# Patient Record
Sex: Female | Born: 1950 | Race: White | Hispanic: No | Marital: Married | Smoking: Never smoker
Health system: Southern US, Community
[De-identification: ages and names within clinical notes are randomized; demographics above are authoritative.]

## PROBLEM LIST (undated history)

## (undated) HISTORY — PX: WRIST SURGERY: SHX841

---

## 2014-05-18 ENCOUNTER — Encounter (HOSPITAL_BASED_OUTPATIENT_CLINIC_OR_DEPARTMENT_OTHER): Payer: Self-pay | Admitting: Emergency Medicine

## 2014-05-18 ENCOUNTER — Emergency Department (HOSPITAL_BASED_OUTPATIENT_CLINIC_OR_DEPARTMENT_OTHER)
Admission: EM | Admit: 2014-05-18 | Discharge: 2014-05-18 | Disposition: A | Discharge status: Home / Self Care | Payer: BC Managed Care – PPO | Attending: Emergency Medicine | Admitting: Emergency Medicine

## 2014-05-18 DIAGNOSIS — Y9289 Other specified places as the place of occurrence of the external cause: Secondary | ICD-10-CM | POA: Insufficient documentation

## 2014-05-18 DIAGNOSIS — S40029A Contusion of unspecified upper arm, initial encounter: Secondary | ICD-10-CM | POA: Insufficient documentation

## 2014-05-18 DIAGNOSIS — Z23 Encounter for immunization: Secondary | ICD-10-CM | POA: Insufficient documentation

## 2014-05-18 DIAGNOSIS — Y9389 Activity, other specified: Secondary | ICD-10-CM | POA: Insufficient documentation

## 2014-05-18 DIAGNOSIS — W540XXA Bitten by dog, initial encounter: Secondary | ICD-10-CM | POA: Insufficient documentation

## 2014-05-18 DIAGNOSIS — S61209A Unspecified open wound of unspecified finger without damage to nail, initial encounter: Secondary | ICD-10-CM | POA: Insufficient documentation

## 2014-05-18 MED ORDER — TETANUS-DIPHTH-ACELL PERTUSSIS 5-2.5-18.5 LF-MCG/0.5 IM SUSP
0.5000 mL | Freq: Once | INTRAMUSCULAR | Status: AC
  Administered 2014-05-18: 0.5 mL via INTRAMUSCULAR
  Filled 2014-05-18: qty 0.5

## 2014-05-18 MED ORDER — IBUPROFEN 800 MG PO TABS: 800.0000 mg | ORAL_TABLET | Freq: Three times a day (TID) | ORAL | Status: AC

## 2014-05-18 MED ORDER — AMOXICILLIN-POT CLAVULANATE 875-125 MG PO TABS
1.0000 | ORAL_TABLET | Freq: Once | ORAL | Status: AC
  Administered 2014-05-18: 1 via ORAL
  Filled 2014-05-18: qty 1

## 2014-05-18 MED ORDER — AMOXICILLIN-POT CLAVULANATE 875-125 MG PO TABS: 1.0000 | ORAL_TABLET | Freq: Two times a day (BID) | ORAL | Status: DC

## 2014-05-18 NOTE — ED Notes (Signed)
Pt soaking in betadine

## 2014-05-18 NOTE — ED Notes (Signed)
"  I got between two dogs".  Deep linear wounds to both sides of right 5th finger. Dogs belong to pt.

## 2014-05-18 NOTE — ED Notes (Addendum)
separating 2 dogs from fight  Has lac to rt little finger  approx 1 inch on both sides  Bleeding controlled   Dogs belong to pt   Dogs are utd on shots

## 2014-05-18 NOTE — ED Notes (Signed)
MD at bedside. 

## 2014-05-18 NOTE — Discharge Instructions (Signed)

## 2014-05-18 NOTE — ED Provider Notes (Signed)
CSN: [id number]     Arrival date & time 05/18/14  2108 History  This chart was scribed for Roberta Angell Smitty CordsK Breckyn Ticas-Rasch, MD by Quintella ReichertMatthew Underwood, ED scribe.  This patient was seen in room MH12/MH12 and the patient's care was started at 11:25 PM.   Chief Complaint  Patient presents with  . Animal Bite    [name] presenting with animal bite. The history is provided by the patient. No language interpreter was used.  Animal Bite Contact animal:  Dog (2 dogs that were patient's own with up to date shots) Location:  Finger Finger injury location:  R little finger Pain details:    Severity:  No pain   Progression:  Unchanged Incident location:  Home Provoked: the dogs were fighting and she intervened.   Notifications:  None Animal's rabies vaccination status:  Up to date Animal in possession: yes   Tetanus status: received today in the ED. Relieved by:  Nothing Worsened by:  Nothing tried Ineffective treatments:  None tried Associated symptoms: no fever     HPI Comments: Roberta Bush is a 63 y.o. female who presents to the Emergency Department complaining of a dog bite to the right 5th finger sustained pta.  Pt was between her dogs which are who are UTD on their shots when she was bit on both sides of the 5th finger.      History reviewed. No pertinent past medical history. Past Surgical History  Procedure Laterality Date  . Wrist surgery     No family history on file. History  Substance Use Topics  . Smoking status: Never Smoker   . Smokeless tobacco: Not on file  . Alcohol Use: Yes     Comment: socially   OB History   Grav Para Term Preterm Abortions TAB SAB Ect Mult Living                   Review of Systems  Constitutional: Negative for fever.  Skin: Positive for wound.  All other systems reviewed and are negative.     Allergies  Review of patient's allergies indicates no known allergies.  Home Medications   Prior to Admission  medications   Not on File   There were no vitals taken for this visit.  Physical Exam  Nursing note and vitals reviewed. Constitutional: She is oriented to person, place, and time. She appears well-developed and well-nourished. No distress.  HENT:  Head: Normocephalic and atraumatic.  Mouth/Throat: Oropharynx is clear and moist.  Eyes: EOM are normal. Pupils are equal, round, and reactive to light.  Neck: Normal range of motion. Neck supple. No tracheal deviation present.  Cardiovascular: Normal rate and regular rhythm.   Pulmonary/Chest: Effort normal and breath sounds normal. No respiratory distress. She has no wheezes. She has no rales.  Abdominal: Soft. Bowel sounds are normal. There is no tenderness. There is no rebound.  Musculoskeletal: Normal range of motion.  Neurological: She is alert and oriented to person, place, and time.  Skin: Skin is warm and dry.  Superficial laceration on lateral right 5th digit, longer laceration on medial palmar aspect.  Scratches and bruise on distal left dorsal forearm  Psychiatric: She has a normal mood and affect. Her behavior is normal.    ED Course  Procedures (including critical care time)  COORDINATION OF CARE: 11:28 PM-Discussed treatment plan which includes Steri-Strips and antibiotics with pt at bedside and pt agreed to plan.     Labs Review  Labs Reviewed - No data to display  Imaging Review No results found.   EKG Interpretation None      MDM   Final diagnoses:  None    Due to the risk of infection as 2 dog bite, will not close the wounds as they will become infected.  steri strips applied to approximate wounds and bulky dressing applied.  Augmentin initiated x 10 days and follow up with hand surgery for ongoing care.  Return for fevers > 101, purulent drainage streaking up the hand or any concerns.      I personally performed the services described in this documentation, which was scribed in my presence. The  recorded information has been reviewed and is accurate.     Jasmine Awe, MD 05/19/14 [phone]

## 2014-05-19 ENCOUNTER — Encounter (HOSPITAL_BASED_OUTPATIENT_CLINIC_OR_DEPARTMENT_OTHER): Payer: Self-pay | Admitting: Emergency Medicine

## 2014-05-23 ENCOUNTER — Encounter (HOSPITAL_BASED_OUTPATIENT_CLINIC_OR_DEPARTMENT_OTHER): Payer: Self-pay | Admitting: Emergency Medicine

## 2014-05-23 ENCOUNTER — Emergency Department (HOSPITAL_BASED_OUTPATIENT_CLINIC_OR_DEPARTMENT_OTHER)
Admission: EM | Admit: 2014-05-23 | Discharge: 2014-05-23 | Disposition: A | Discharge status: Home / Self Care | Payer: BC Managed Care – PPO | Attending: Emergency Medicine | Admitting: Emergency Medicine

## 2014-05-23 DIAGNOSIS — S81009A Unspecified open wound, unspecified knee, initial encounter: Secondary | ICD-10-CM | POA: Insufficient documentation

## 2014-05-23 DIAGNOSIS — T63001A Toxic effect of unspecified snake venom, accidental (unintentional), initial encounter: Secondary | ICD-10-CM | POA: Insufficient documentation

## 2014-05-23 DIAGNOSIS — S81809A Unspecified open wound, unspecified lower leg, initial encounter: Principal | ICD-10-CM

## 2014-05-23 DIAGNOSIS — Y9289 Other specified places as the place of occurrence of the external cause: Secondary | ICD-10-CM | POA: Insufficient documentation

## 2014-05-23 DIAGNOSIS — Z9889 Other specified postprocedural states: Secondary | ICD-10-CM | POA: Diagnosis not present

## 2014-05-23 DIAGNOSIS — Z792 Long term (current) use of antibiotics: Secondary | ICD-10-CM | POA: Insufficient documentation

## 2014-05-23 DIAGNOSIS — Z791 Long term (current) use of non-steroidal anti-inflammatories (NSAID): Secondary | ICD-10-CM | POA: Diagnosis not present

## 2014-05-23 DIAGNOSIS — T63121A Toxic effect of venom of other venomous lizard, accidental (unintentional), initial encounter: Secondary | ICD-10-CM | POA: Diagnosis not present

## 2014-05-23 DIAGNOSIS — T63004A Toxic effect of unspecified snake venom, undetermined, initial encounter: Secondary | ICD-10-CM

## 2014-05-23 DIAGNOSIS — S91009A Unspecified open wound, unspecified ankle, initial encounter: Principal | ICD-10-CM

## 2014-05-23 DIAGNOSIS — Y9301 Activity, walking, marching and hiking: Secondary | ICD-10-CM | POA: Diagnosis not present

## 2014-05-23 LAB — CBC WITH DIFFERENTIAL/PLATELET
BASOS PCT: 0 % (ref 0–1)
Basophils Absolute: 0 10*3/uL (ref 0.0–0.1)
Basophils Absolute: 0 10*3/uL (ref 0.0–0.1)
Basophils Relative: 1 % (ref 0–1)
EOS ABS: 0.2 10*3/uL (ref 0.0–0.7)
EOS PCT: 3 % (ref 0–5)
Eosinophils Absolute: 0.2 10*3/uL (ref 0.0–0.7)
Eosinophils Relative: 2 % (ref 0–5)
HCT: 39.1 % (ref 36.0–46.0)
HEMATOCRIT: 42.2 % (ref 36.0–46.0)
HEMOGLOBIN: 13.4 g/dL (ref 12.0–15.0)
Hemoglobin: 14.5 g/dL (ref 12.0–15.0)
LYMPHS ABS: 2.3 10*3/uL (ref 0.7–4.0)
Lymphocytes Relative: 29 % (ref 12–46)
Lymphocytes Relative: 18 % (ref 12–46)
Lymphs Abs: 1.7 10*3/uL (ref 0.7–4.0)
MCH: 31.2 pg (ref 26.0–34.0)
MCH: 31 pg (ref 26.0–34.0)
MCHC: 34.4 g/dL (ref 30.0–36.0)
MCHC: 34.3 g/dL (ref 30.0–36.0)
MCV: 90.8 fL (ref 78.0–100.0)
MCV: 90.5 fL (ref 78.0–100.0)
MONO ABS: 0.6 10*3/uL (ref 0.1–1.0)
MONOS PCT: 7 % (ref 3–12)
Monocytes Absolute: 0.6 10*3/uL (ref 0.1–1.0)
Monocytes Relative: 8 % (ref 3–12)
NEUTROS PCT: 60 % (ref 43–77)
NEUTROS PCT: 73 % (ref 43–77)
Neutro Abs: 4.7 10*3/uL (ref 1.7–7.7)
Neutro Abs: 6.6 10*3/uL (ref 1.7–7.7)
Platelets: 310 10*3/uL (ref 150–400)
Platelets: 290 10*3/uL (ref 150–400)
RBC: 4.65 MIL/uL (ref 3.87–5.11)
RBC: 4.32 MIL/uL (ref 3.87–5.11)
RDW: 14 % (ref 11.5–15.5)
RDW: 13.9 % (ref 11.5–15.5)
WBC: 7.9 10*3/uL (ref 4.0–10.5)
WBC: 9.1 10*3/uL (ref 4.0–10.5)

## 2014-05-23 LAB — BASIC METABOLIC PANEL
BUN: 21 mg/dL (ref 6–23)
CO2: 24 mEq/L (ref 19–32)
Calcium: 9.7 mg/dL (ref 8.4–10.5)
Chloride: 104 mEq/L (ref 96–112)
Creatinine, Ser: 0.8 mg/dL (ref 0.50–1.10)
GFR calc Af Amer: >90 mL/min (ref >90)
GFR calc non Af Amer: 78 mL/min — ABNORMAL LOW (ref >90)
Glucose, Bld: 113 mg/dL — ABNORMAL HIGH (ref 70–99)
POTASSIUM: 4 meq/L (ref 3.7–5.3)
Sodium: 144 mEq/L (ref 137–147)

## 2014-05-23 LAB — APTT: aPTT: 32 seconds (ref 24–37)

## 2014-05-23 LAB — PROTIME-INR
INR: 1.04 (ref 0.00–1.49)
PROTHROMBIN TIME: 13.4 s (ref 11.6–15.2)

## 2014-05-23 LAB — CK: Total CK: 117 U/L (ref 7–177)

## 2014-05-23 MED ORDER — ONDANSETRON HCL 4 MG/2ML IJ SOLN
INTRAMUSCULAR | Status: AC
  Filled 2014-05-23: qty 2

## 2014-05-23 MED ORDER — SODIUM CHLORIDE 0.9 % IV SOLN
INTRAVENOUS | Status: DC
  Administered 2014-05-23: 01:00:00 via INTRAVENOUS

## 2014-05-23 MED ORDER — ONDANSETRON 8 MG PO TBDP: 8.0000 mg | ORAL_TABLET | Freq: Three times a day (TID) | ORAL | Status: AC | PRN

## 2014-05-23 MED ORDER — FENTANYL CITRATE 0.05 MG/ML IJ SOLN
100.0000 ug | Freq: Once | INTRAMUSCULAR | Status: AC
  Administered 2014-05-23: 100 ug via INTRAVENOUS
  Filled 2014-05-23: qty 2

## 2014-05-23 MED ORDER — HYDROCODONE-ACETAMINOPHEN 5-325 MG PO TABS: 1.0000 | ORAL_TABLET | Freq: Four times a day (QID) | ORAL | Status: DC | PRN

## 2014-05-23 MED ORDER — ONDANSETRON HCL 4 MG/2ML IJ SOLN
4.0000 mg | Freq: Once | INTRAMUSCULAR | Status: AC
  Administered 2014-05-23: 4 mg via INTRAVENOUS

## 2014-05-23 NOTE — ED Notes (Signed)
Pt c/o snake bite to right ankle x 30 mins ago

## 2014-05-23 NOTE — ED Notes (Signed)
Pt nauseated and vomited x 1, immediate relief with zofran, pt po challenged. Able to tolerate fluids and graham crackers. Vs remain stable

## 2014-05-23 NOTE — ED Provider Notes (Addendum)
CSN: [id number]     Arrival date & time 05/23/14  0053 History   First MD Initiated Contact with Patient 05/23/14 0105     Chief Complaint  Patient presents with  . Snake Bite     (Consider location/radiation/quality/duration/timing/severity/associated sxs/prior Treatment) HPI This is a 63 year old female who was walking out of her car about 30 minutes prior to arrival. She was bitten on the right ankle by a snake. The snake was about 16 inches long and a photograph of it taken by her husband reveals it to be a copperhead. She is a single puncture wound to the medial aspect of the right ankle. There is mild, burning pain associated with it. There is little or no swelling. There was some surrounding erythema at the scene and EMS marked improvement her. She denies any systemic symptoms such as chills, fever or body aches. She was recently seen here for dog bite to her right fifth finger and left forearm; her tetanus was updated at that time.  History reviewed. No pertinent past medical history. Past Surgical History  Procedure Laterality Date  . Wrist surgery     History reviewed. No pertinent family history. History  Substance Use Topics  . Smoking status: Never Smoker   . Smokeless tobacco: Not on file  . Alcohol Use: Yes     Comment: socially   OB History   Grav Para Term Preterm Abortions TAB SAB Ect Mult Living                 Review of Systems  All other systems reviewed and are negative.   Allergies  Review of patient's allergies indicates no known allergies.  Home Medications   Prior to Admission medications   Medication Sig Start Date End Date Taking? Authorizing Provider  amoxicillin-clavulanate (AUGMENTIN) 875-125 MG per tablet Take 1 tablet by mouth 2 (two) times daily. One po bid x 10 days 05/18/14   April K Palumbo-Rasch, MD  ibuprofen (ADVIL,MOTRIN) 800 MG tablet Take 1 tablet (800 mg total) by mouth 3 (three) times daily. 05/18/14   April K Palumbo-Rasch, MD    BP 151/82  Pulse 65  Temp(Src) 98.3 F (36.8 C) (Oral)  Resp 16  Ht 5\' 4"  (1.626 m)  Wt 220 lb (99.791 kg)  BMI 37.74 kg/m2  SpO2 97%  Physical Exam General: Well-developed, well-nourished female in no acute distress; appearance consistent with age of record HENT: normocephalic; atraumatic Eyes: pupils equal, round and reactive to light; extraocular muscles intact Neck: supple Heart: regular rate and rhythm; no murmurs, rubs or gallops Lungs: clear to auscultation bilaterally Abdomen: soft; nondistended; nontender; no masses or hepatosplenomegaly; bowel sounds present Extremities: No deformity; full range of motion; pulses normal and symmetric; trace edema of lower legs bilaterally; splinted right fifth finger Neurologic: Awake, alert and oriented; motor function intact in all extremities and symmetric; no facial droop Skin: Warm and dry; ecchymoses and healing abrasions to left forearm; single puncture wound to right ankle as shown, mildly tender:   Psychiatric: Normal mood and affect    ED Course  Procedures (including critical care time)  MDM   Nursing notes and vitals signs, including pulse oximetry, reviewed.  Summary of this visit's results, reviewed by myself:  Labs:  Results for orders placed during the hospital encounter of 05/23/14 (from the past 24 hour(s))  CBC WITH DIFFERENTIAL     Status: None   Collection Time    05/23/14  1:10 AM      Result  Value Ref Range   WBC 7.9  4.0 - 10.5 K/uL   RBC 4.65  3.87 - 5.11 MIL/uL   Hemoglobin 14.5  12.0 - 15.0 g/dL   HCT 16.142.2  09.636.0 - 04.546.0 %   MCV 90.8  78.0 - 100.0 fL   MCH 31.2  26.0 - 34.0 pg   MCHC 34.4  30.0 - 36.0 g/dL   RDW 40.914.0  81.111.5 - 91.415.5 %   Platelets 310  150 - 400 K/uL   Neutrophils Relative % 60  43 - 77 %   Neutro Abs 4.7  1.7 - 7.7 K/uL   Lymphocytes Relative 29  12 - 46 %   Lymphs Abs 2.3  0.7 - 4.0 K/uL   Monocytes Relative 8  3 - 12 %   Monocytes Absolute 0.6  0.1 - 1.0 K/uL   Eosinophils  Relative 3  0 - 5 %   Eosinophils Absolute 0.2  0.0 - 0.7 K/uL   Basophils Relative 1  0 - 1 %   Basophils Absolute 0.0  0.0 - 0.1 K/uL  BASIC METABOLIC PANEL     Status: Abnormal   Collection Time    05/23/14  1:10 AM      Result Value Ref Range   Sodium 144  137 - 147 mEq/L   Potassium 4.0  3.7 - 5.3 mEq/L   Chloride 104  96 - 112 mEq/L   CO2 24  19 - 32 mEq/L   Glucose, Bld 113 (*) 70 - 99 mg/dL   BUN 21  6 - 23 mg/dL   Creatinine, Ser 7.820.80  0.50 - 1.10 mg/dL   Calcium 9.7  8.4 - 95.610.5 mg/dL   GFR calc non Af Amer 78 (*) >90 mL/min   GFR calc Af Amer >90  >90 mL/min  CBC WITH DIFFERENTIAL     Status: None   Collection Time    05/23/14  5:50 AM      Result Value Ref Range   WBC 9.1  4.0 - 10.5 K/uL   RBC 4.32  3.87 - 5.11 MIL/uL   Hemoglobin 13.4  12.0 - 15.0 g/dL   HCT 21.339.1  08.636.0 - 57.846.0 %   MCV 90.5  78.0 - 100.0 fL   MCH 31.0  26.0 - 34.0 pg   MCHC 34.3  30.0 - 36.0 g/dL   RDW 46.913.9  62.911.5 - 52.815.5 %   Platelets 290  150 - 400 K/uL   Neutrophils Relative % 73  43 - 77 %   Neutro Abs 6.6  1.7 - 7.7 K/uL   Lymphocytes Relative 18  12 - 46 %   Lymphs Abs 1.7  0.7 - 4.0 K/uL   Monocytes Relative 7  3 - 12 %   Monocytes Absolute 0.6  0.1 - 1.0 K/uL   Eosinophils Relative 2  0 - 5 %   Eosinophils Absolute 0.2  0.0 - 0.7 K/uL   Basophils Relative 0  0 - 1 %   Basophils Absolute 0.0  0.0 - 0.1 K/uL  CK     Status: None   Collection Time    05/23/14  5:50 AM      Result Value Ref Range   Total CK 117  7 - 177 U/L  PROTIME-INR     Status: None   Collection Time    05/23/14  5:50 AM      Result Value Ref Range   Prothrombin Time 13.4  11.6 - 15.2 seconds  INR 1.04  0.00 - 1.49  APTT     Status: None   Collection Time    05/23/14  5:50 AM      Result Value Ref Range   aPTT 32  24 - 37 seconds     1:17 AM The patient is not at this time appear to have significant envenomation. There is no indication for CroFab administration at this time. We will observe her and watch  for progression. Wound cleansed and surrounding area of mild erythema remarked.  2:49 AM The patient is complaining of increased discomfort and tenderness at the bite site. There is still no appreciable swelling, induration or erythema. Dorsalis pedis pulse is still +2.  4:35 AM Still no appreciable swelling, induration or erythema. Dorsalis pedis pulse is still +2. Pain adequately controlled with fentanyl.  6:00 AM Still no appreciable swelling, induration or erythema. Dorsalis pedis pulse is still +2. Patient's pain has returned and we will redose fentanyl. Repeat blood tests drawn to evaluate for systemic toxicity.   6:20 AM No laboratory evidence of systemic toxicity. No progression at site, as shown:   Likely a dry bite or bite with minimal envenomation. Patient advised to return to a hospital-based ED [facility] or [facility] as our CroFab supply is limited to initial dose only) for worsening swelling, pain, discoloration, systemic symptoms.       Hanley Seamen, MD 05/23/14 0700  Hanley Seamen, MD 05/23/14 [phone]

## 2014-05-23 NOTE — ED Notes (Signed)
Pt lying in bed resting, no increased redness or swelling to rle, no changes or further discoloration to punture site. Pt reports increased pain again, states " its just throbbing at a level 5", alert oriented, vss

## 2015-11-09 ENCOUNTER — Emergency Department (HOSPITAL_BASED_OUTPATIENT_CLINIC_OR_DEPARTMENT_OTHER)
Admission: EM | Admit: 2015-11-09 | Discharge: 2015-11-09 | Disposition: A | Discharge status: Home / Self Care | Payer: BLUE CROSS/BLUE SHIELD | Attending: Emergency Medicine | Admitting: Emergency Medicine

## 2015-11-09 ENCOUNTER — Encounter (HOSPITAL_BASED_OUTPATIENT_CLINIC_OR_DEPARTMENT_OTHER): Payer: Self-pay | Admitting: Emergency Medicine

## 2015-11-09 DIAGNOSIS — Y998 Other external cause status: Secondary | ICD-10-CM | POA: Diagnosis not present

## 2015-11-09 DIAGNOSIS — Y9389 Activity, other specified: Secondary | ICD-10-CM | POA: Insufficient documentation

## 2015-11-09 DIAGNOSIS — W540XXA Bitten by dog, initial encounter: Secondary | ICD-10-CM | POA: Insufficient documentation

## 2015-11-09 DIAGNOSIS — Z791 Long term (current) use of non-steroidal anti-inflammatories (NSAID): Secondary | ICD-10-CM | POA: Diagnosis not present

## 2015-11-09 DIAGNOSIS — S61451A Open bite of right hand, initial encounter: Secondary | ICD-10-CM

## 2015-11-09 DIAGNOSIS — Z9889 Other specified postprocedural states: Secondary | ICD-10-CM | POA: Insufficient documentation

## 2015-11-09 DIAGNOSIS — Y9289 Other specified places as the place of occurrence of the external cause: Secondary | ICD-10-CM | POA: Diagnosis not present

## 2015-11-09 DIAGNOSIS — S61411A Laceration without foreign body of right hand, initial encounter: Secondary | ICD-10-CM | POA: Insufficient documentation

## 2015-11-09 MED ORDER — AMOXICILLIN-POT CLAVULANATE 875-125 MG PO TABS
1.0000 | ORAL_TABLET | Freq: Two times a day (BID) | ORAL | Status: AC
Start: 2015-11-09 — End: ?

## 2015-11-09 MED ORDER — HYDROCODONE-ACETAMINOPHEN 5-325 MG PO TABS: 1.0000 | ORAL_TABLET | Freq: Four times a day (QID) | ORAL | Status: AC | PRN

## 2015-11-09 MED ORDER — SODIUM CHLORIDE 0.9 % IV SOLN
1.5000 g | Freq: Once | INTRAVENOUS | Status: AC
  Administered 2015-11-09: 1.5 g via INTRAVENOUS
  Filled 2015-11-09: qty 1.5

## 2015-11-09 NOTE — Discharge Instructions (Signed)
Animal Bite °Animal bites can range from mild to serious. An animal bite can result in a scratch on the skin, a deep open cut, a puncture of the skin, a crush injury, or tearing away of the skin or a body part. A small bite from a house pet will usually not cause serious problems. However, some animal bites can become infected or injure a bone or other tissue.  °Bites from certain animals can be more dangerous because of the risk of spreading rabies, which is a serious viral infection. This risk is higher with bites from stray animals or wild animals, such as raccoons, foxes, skunks, and bats. Dogs are responsible for most animal bites. Children are bitten more often than adults. °SYMPTOMS  °Common symptoms of an animal bite include:  °· Pain.   °· Bleeding.   °· Swelling.   °· Bruising.   °DIAGNOSIS  °This condition may be diagnosed based on a physical exam and medical history. Your health care provider will examine the wound and ask for details about the animal and how the bite happened. You may also have tests, such as:  °· Blood tests to check for infection or to determine if surgery is needed. °· X-rays to check for damage to bones or joints. °· Culture test. This uses a sample of fluid from the wound to check for infection. °TREATMENT  °Treatment varies depending on the location and type of animal bite and your medical history. Treatment may include:  °· Wound care. This often includes cleaning the wound, flushing the wound with saline solution, and applying a bandage (dressing). Sometimes, the wound is left open to heal because of the high risk of infection. However, in some cases, the wound may be closed with stitches (sutures), staples, skin glue, or adhesive strips.   °· Antibiotic medicine.   °· Tetanus shot.   °· Rabies treatment if the animal could have rabies.   °In some cases, bites that have become infected may require IV antibiotics and surgical treatment in the hospital.  °HOME CARE  INSTRUCTIONS °Wound Care  °· Follow instructions from your health care provider about how to take care of your wound. Make sure you: °¨ Wash your hands with soap and water before you change your dressing. If soap and water are not available, use hand sanitizer. °¨ Change your dressing as told by your health care provider. °¨ Leave sutures, skin glue, or adhesive strips in place. These skin closures may need to be in place for 2 weeks or longer. If adhesive strip edges start to loosen and curl up, you may trim the loose edges. Do not remove adhesive strips completely unless your health care provider tells you to do that. °· Check your wound every day for signs of infection. Watch for:   °¨  Increasing redness, swelling, or pain.   °¨  Fluid, blood, or pus.   °General Instructions  °· Take or apply over-the-counter and prescription medicines only as told by your health care provider.   °· If you were prescribed an antibiotic, take or apply it as told by your health care provider. Do not stop using the antibiotic even if your condition improves.   °· Keep the injured area raised (elevated) above the level of your heart while you are sitting or lying down, if this is possible.   °· If directed, apply ice to the injured area.   °¨  Put ice in a plastic bag.   °¨  Place a towel between your skin and the bag.   °¨  Leave the ice on for 20 minutes, 2-3 times per day.   °·   Keep all follow-up visits as told by your health care provider. This is important.  SEEK MEDICAL CARE IF:  You have increasing redness, swelling, or pain at the site of your wound.   You have a general feeling of sickness (malaise).   You feel nauseous or you vomit.   You have pain that does not get better.  SEEK IMMEDIATE MEDICAL CARE IF:  You have a red streak extending away from your wound.   You have fluid, blood, or pus coming from your wound.   You have a fever or chills.   You have trouble moving your injured area.   You  have new numbness or tingling extending beyond the wound.   This information is not intended to replace advice given to you by your health care provider. Make sure you discuss any questions you have with your health care provider.   Document Released: 08/26/2011 Document Revised: 08/29/2015 Document Reviewed: 04/25/2015 Elsevier Interactive Patient Education [employer]2016 [employer]

## 2015-11-09 NOTE — ED Provider Notes (Signed)
CSN: [id number]     Arrival date & time 11/09/15  0044 History   First MD Initiated Contact with Patient 11/09/15 0114     Chief Complaint  Patient presents with  . Animal Bite     (Consider location/radiation/quality/duration/timing/severity/associated sxs/prior Treatment) HPI  64 year old female who broke up a fight between HER-2 dogs earlier this morning. In the process one of her dogs bit her right hand. She has a laceration to her dorsal right hand. There is no functional deficit but there is numbness of the skin distal to the wound. There is moderate associated pain, worse with movement or palpation. She is up-to-date on her tetanus. Her dogs are up-to-date on their rabies shots. In  History reviewed. No pertinent past medical history. Past Surgical History  Procedure Laterality Date  . Wrist surgery     History reviewed. No pertinent family history. Social History  Substance Use Topics  . Smoking status: Never Smoker   . Smokeless tobacco: None  . Alcohol Use: Yes     Comment: socially   OB History    No data available     Review of Systems  All other systems reviewed and are negative.   Allergies  Review of patient's allergies indicates no known allergies.  Home Medications   Prior to Admission medications   Medication Sig Start Date End Date Taking? Authorizing Provider  amoxicillin-clavulanate (AUGMENTIN) 875-125 MG per tablet Take 1 tablet by mouth 2 (two) times daily. One po bid x 10 days 05/18/14   April Palumbo, MD  HYDROcodone-acetaminophen (NORCO/VICODIN) 5-325 MG per tablet Take 1-2 tablets by mouth every 6 (six) hours as needed for moderate pain. 05/23/14   Deborra Phegley, MD  ibuprofen (ADVIL,MOTRIN) 800 MG tablet Take 1 tablet (800 mg total) by mouth 3 (three) times daily. 05/18/14   April Palumbo, MD  ondansetron (ZOFRAN ODT) 8 MG disintegrating tablet Take 1 tablet (8 mg total) by mouth every 8 (eight) hours as needed for nausea or vomiting. 05/23/14   Bilbo Carcamo, MD   BP 158/84 mmHg  Pulse 82  Temp(Src) 98.5 F (36.9 C) (Oral)  Resp 18  Ht 5' 4"  (1.626 m)  Wt 200 lb (90.719 kg)  BMI 34.31 kg/m2  SpO2 99%   Physical Exam  General: Well-developed, well-nourished female in no acute distress; appearance consistent with age of record HENT: normocephalic; atraumatic Eyes: Normal appearance Neck: supple Heart: regular rate and rhythm Lungs: Normal respiratory effort and excursion Abdomen: soft; nondistended Extremities: No deformity; full range of motion; laceration to dorsal right hand, affected digits are without motor or tendon deficit, digits distally have intact sensation but there is decreased sensation of the skin just distal to the wound Neurologic: Awake, alert and oriented; motor function intact in all extremities and symmetric; no facial droop Skin: Warm and dry Psychiatric: Normal mood and affect    ED Course  Procedures (including critical care time)  LACERATION REPAIR Performed by: Abbagail Scaff L Authorized by: Wynetta Fines Consent: Verbal consent obtained. Risks and benefits: risks, benefits and alternatives were discussed Consent given by: patient Patient identity confirmed: provided demographic data Prepped and Draped in normal sterile fashion Wound explored  Laceration Location: Right hand  Laceration Length: 4 cm  No Foreign Bodies seen or palpated  Anesthesia: local infiltration  Local anesthetic: lidocaine 2 % with epinephrine  Anesthetic total: 6 ml  Irrigation method: syringe Amount of cleaning: standard  Skin closure: 5-0 nylon   Number of sutures: 6, widely spaced to  allow for wound drainage   Technique: Simple interrupted   Patient tolerance: Patient tolerated the procedure well with no immediate complications.      MDM     Shanon Rosser, MD 11/09/15 [phone]

## 2015-11-09 NOTE — ED Notes (Signed)
Patient was bitten by family dog. Pt states she was moving him off bed and patient states bit her had. Pt states dog is up to date on all shots.

## 2020-02-05 ENCOUNTER — Ambulatory Visit: Payer: Medicare Other | Attending: Internal Medicine

## 2021-10-22 ENCOUNTER — Other Ambulatory Visit: Payer: Self-pay | Admitting: Student

## 2021-10-22 DIAGNOSIS — M21612 Bunion of left foot: Secondary | ICD-10-CM

## 2021-11-07 ENCOUNTER — Other Ambulatory Visit: Payer: Self-pay

## 2021-11-07 ENCOUNTER — Ambulatory Visit
Admission: RE | Admit: 2021-11-07 | Discharge: 2021-11-07 | Disposition: A | Discharge status: Home / Self Care | Payer: Medicare Other | Source: Ambulatory Visit | Attending: Student | Admitting: Student

## 2021-11-07 DIAGNOSIS — M21612 Bunion of left foot: Secondary | ICD-10-CM

## 2022-08-03 IMAGING — CT CT FOOT*L* W/O CM
2 of 3 series · 9 of 27 positions shown, 11 images · non-contrast
Comparison: None.

CLINICAL DATA: Left foot pain

EXAM:
CT OF THE LEFT FOOT WITHOUT CONTRAST
TECHNIQUE: Multidetector CT imaging of the left foot was performed according to
the standard protocol. Multiplanar CT image reconstructions were
also generated.

[Series 9: cor soft tissue · axial · 0.33mm/px · z∈[+289,+367]mm · 4 of 93 slices shown, 5 images]
[im 16/93  soft-tissue]
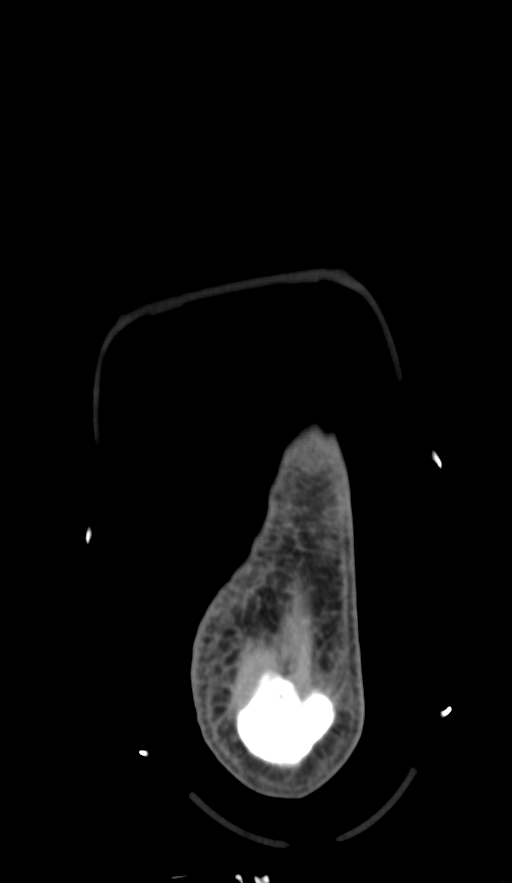
[im 16/93  bone]
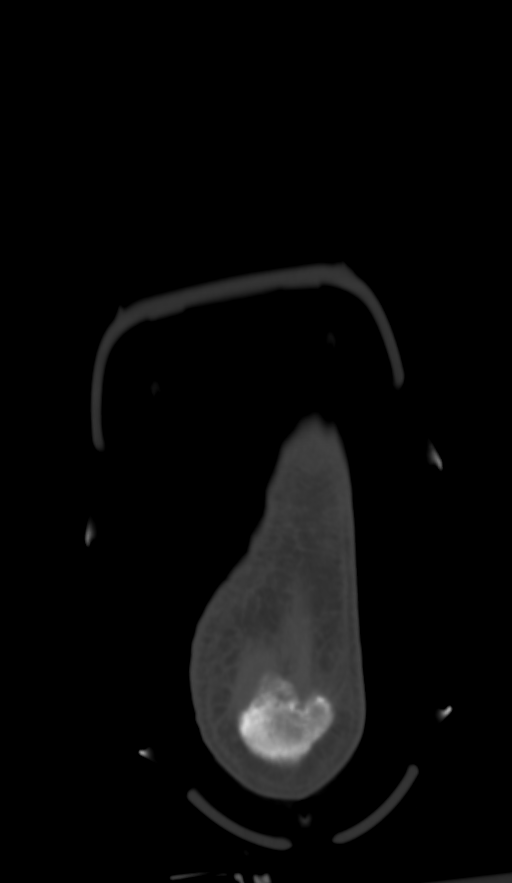
[im 31/93  bone]
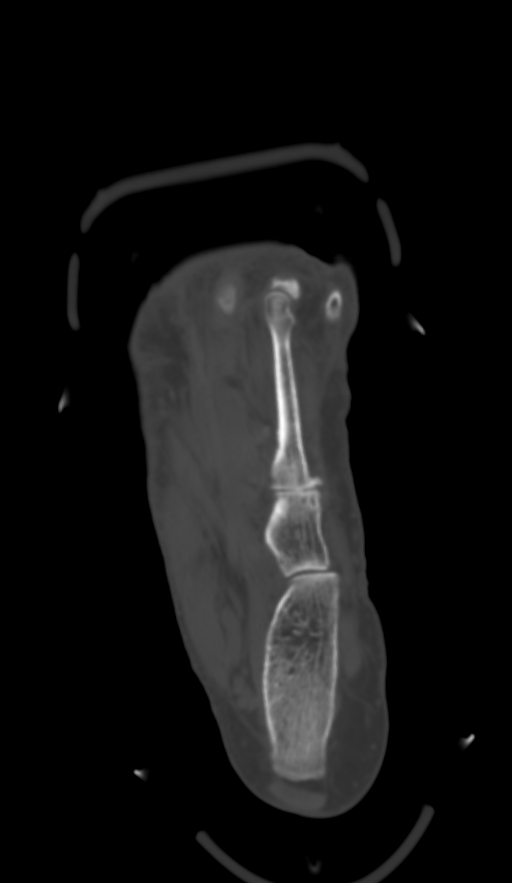
[im 47/93  bone]
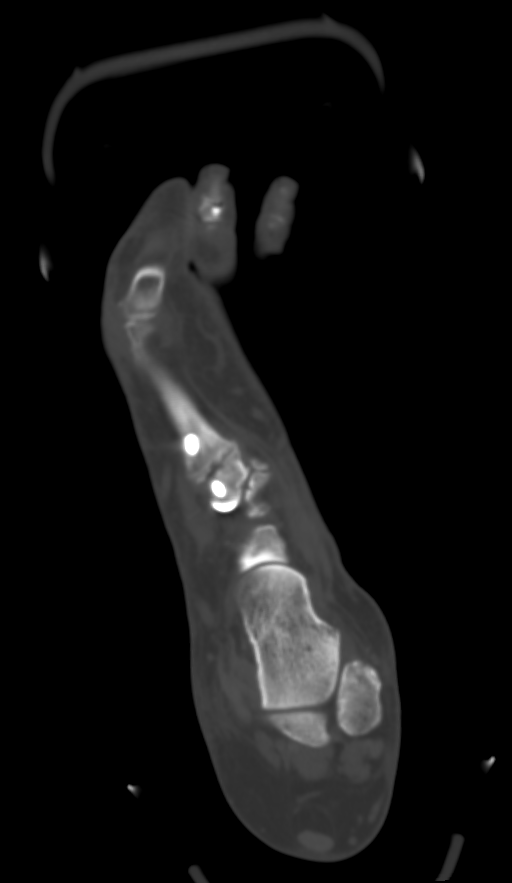
[im 62/93  bone]
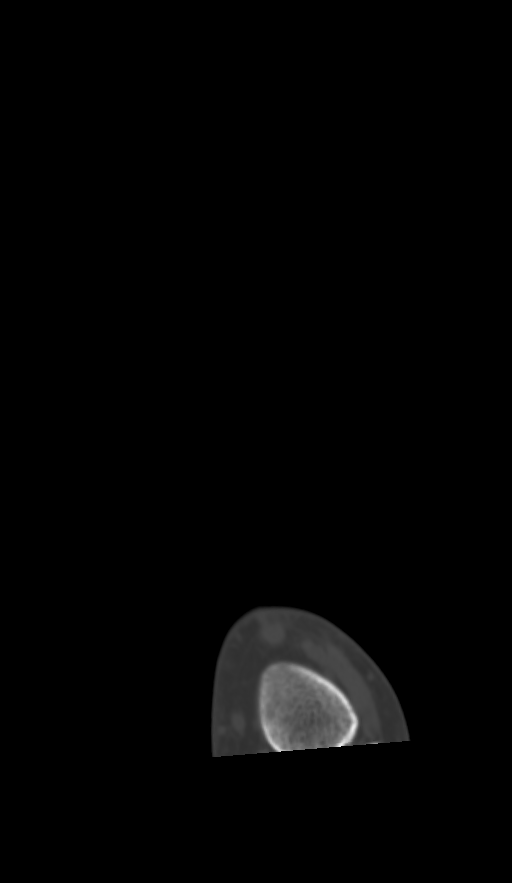

[Series 10: sagsoft tissue · sagittal · 0.30mm/px · 5 of 76 slices shown, 6 images]
[im 26/76  bone]
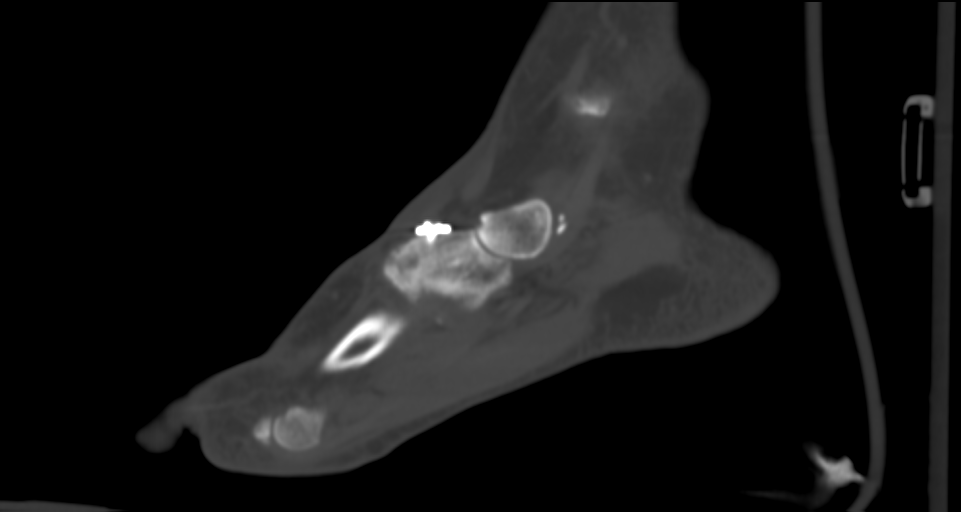
[im 32/76  bone]
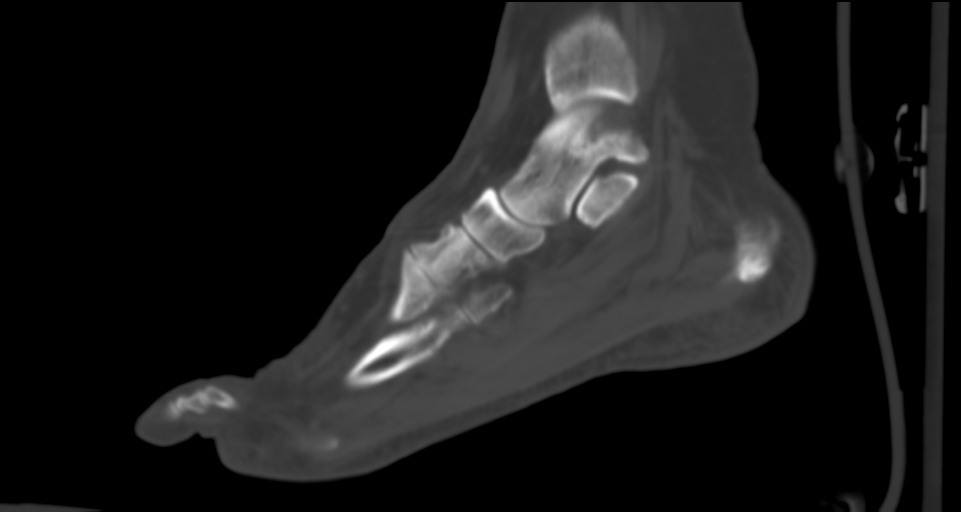
[im 38/76  soft-tissue]
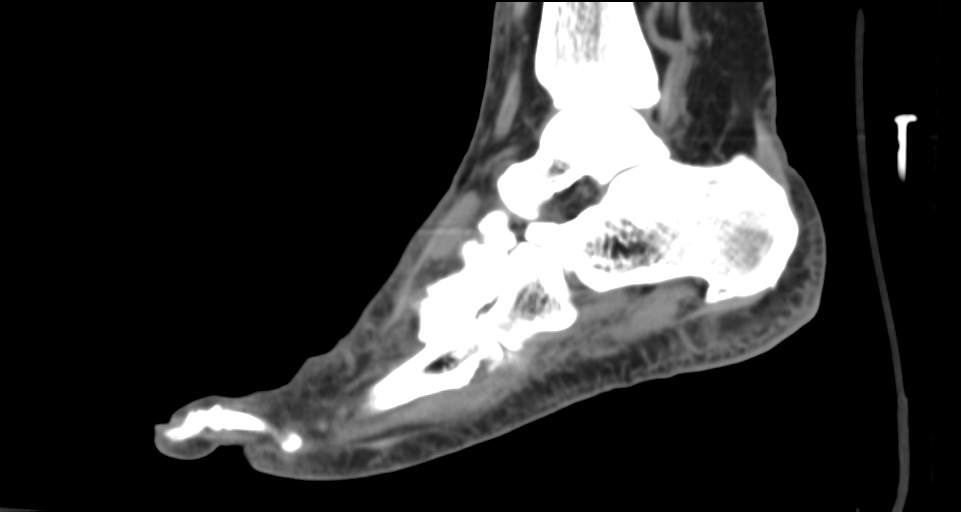
[im 38/76  bone]
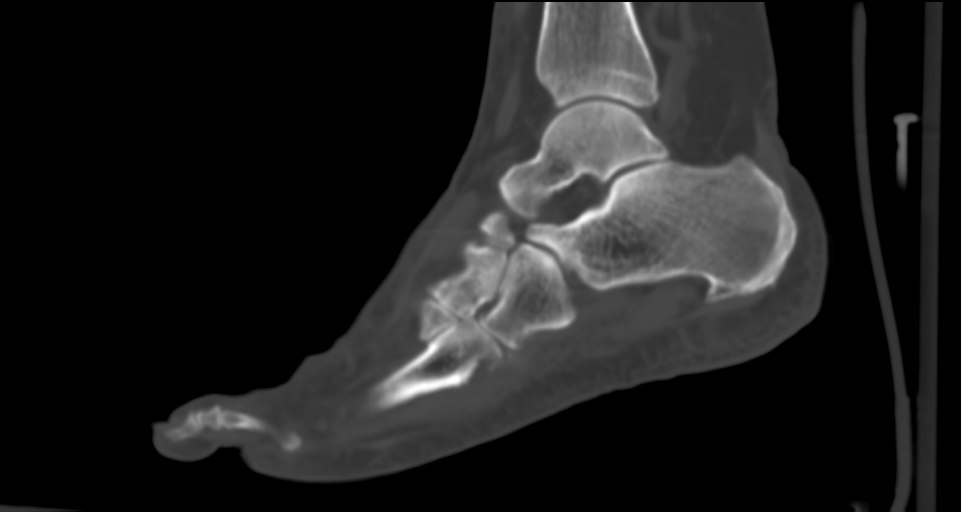
[im 44/76  bone]
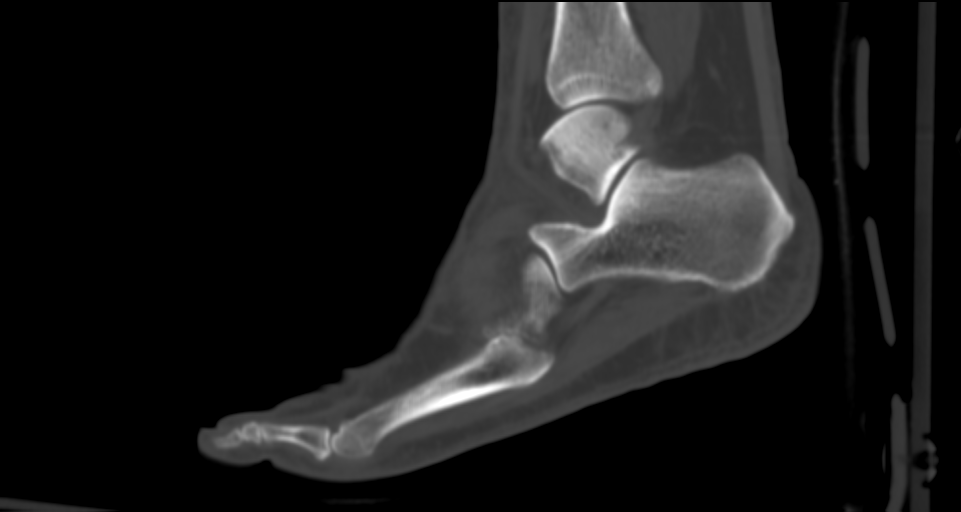
[im 51/76  bone]
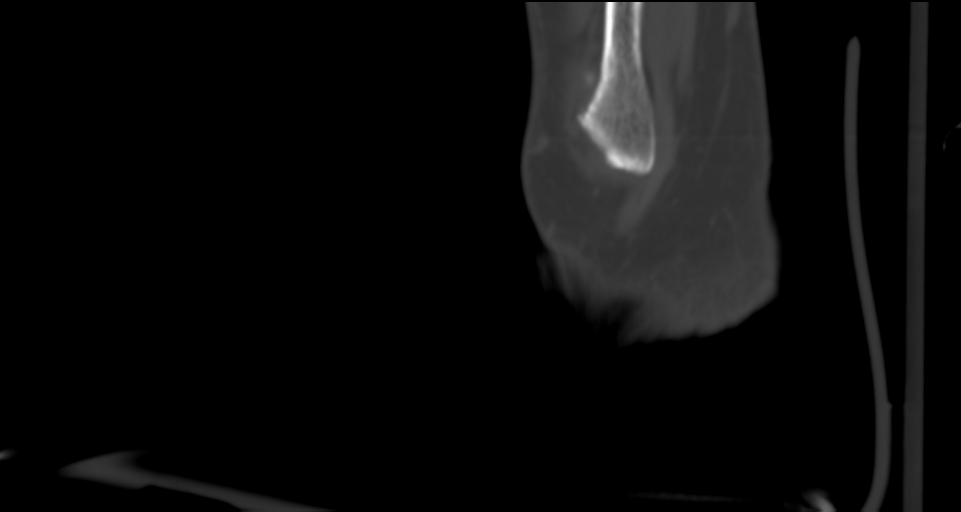

[9 of 27 positions shown; findings below may reference images not displayed]

FINDINGS: Bones/Joint/Cartilage

Prior distal first metatarsal bunionectomy, distal second metatarsal
Rector osteotomy, second toe hammertoe correction, and first
tarsometatarsal arthrodesis. No significant bony bridging across the
second digit PIP joint or the first TMT joint. Hardware is intact
without evidence of loosening or fracture.

There is no acute osseous abnormality. There is persistent hallux
valgus with moderate first MTP degenerative change. There is
moderate second through fourth tarsometatarsal joint osteoarthritis.
Plantar calcaneal spurring. Tiny os navicularis.

Ligaments

Suboptimally assessed by CT.

Muscles and Tendons

No significant muscle atrophy. No acute tendon abnormality on
noncontrast CT.

Soft tissues

No focal fluid collection.  Mild soft tissue swelling of the foot.
IMPRESSION: Postsurgical changes of distal first metatarsal bunionectomy, distal
second metatarsal Rector osteotomy, second toe hammertoe correction,
and first tarsometatarsal arthrodesis. No significant bony bridging
across the first tarsometatarsal joint or the second toe PIP joint.
Intact hardware without evidence of loosening or fracture.

Hallux valgus with moderate first MTP degenerative change.

Moderate osteoarthritis of the second through fourth tarsometatarsal
joints.
